# Patient Record
Sex: Female | Born: 1950
Health system: Southern US, Community
[De-identification: ages and names within clinical notes are randomized; demographics above are authoritative.]

## PROBLEM LIST (undated history)

## (undated) DIAGNOSIS — H9190 Unspecified hearing loss, unspecified ear: Secondary | ICD-10-CM

## (undated) DIAGNOSIS — R768 Other specified abnormal immunological findings in serum: Secondary | ICD-10-CM

## (undated) HISTORY — DX: Unspecified hearing loss, unspecified ear: H91.90

## (undated) HISTORY — DX: Other specified abnormal immunological findings in serum: R76.8

---
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
## 1980-08-23 DIAGNOSIS — H9190 Unspecified hearing loss, unspecified ear: Secondary | ICD-10-CM

## 1980-08-23 HISTORY — DX: Unspecified hearing loss, unspecified ear: H91.90

## 1981-08-23 HISTORY — PX: TUBAL LIGATION: SHX77

## 1986-08-23 HISTORY — PX: COCHLEAR IMPLANT: SUR684

## 2013-05-11 ENCOUNTER — Ambulatory Visit: Payer: Self-pay | Admitting: Family Medicine

## 2013-05-14 ENCOUNTER — Encounter: Payer: Self-pay | Admitting: Family Medicine

## 2013-05-14 ENCOUNTER — Ambulatory Visit (INDEPENDENT_AMBULATORY_CARE_PROVIDER_SITE_OTHER): Payer: BC Managed Care – PPO | Admitting: Family Medicine

## 2013-05-14 VITALS — BP 181/86 | HR 71 | Ht 61.0 in | Wt 139.0 lb

## 2013-05-14 DIAGNOSIS — Z13220 Encounter for screening for lipoid disorders: Secondary | ICD-10-CM

## 2013-05-14 DIAGNOSIS — Z131 Encounter for screening for diabetes mellitus: Secondary | ICD-10-CM

## 2013-05-14 DIAGNOSIS — Z9889 Other specified postprocedural states: Secondary | ICD-10-CM

## 2013-05-14 DIAGNOSIS — Z23 Encounter for immunization: Secondary | ICD-10-CM

## 2013-05-14 DIAGNOSIS — Z9621 Cochlear implant status: Secondary | ICD-10-CM | POA: Insufficient documentation

## 2013-05-14 DIAGNOSIS — I10 Essential (primary) hypertension: Secondary | ICD-10-CM

## 2013-05-14 LAB — BASIC METABOLIC PANEL WITH GFR
BUN: 11 mg/dL (ref 6–23)
CO2: 28 mEq/L (ref 19–32)
Calcium: 9.9 mg/dL (ref 8.4–10.5)
Chloride: 104 mEq/L (ref 96–112)
Creat: 0.66 mg/dL (ref 0.50–1.10)
GFR, Est African American: >89 mL/min
GFR, Est Non African American: >89 mL/min
Glucose, Bld: 89 mg/dL (ref 70–99)
Potassium: 4.1 mEq/L (ref 3.5–5.3)
Sodium: 141 mEq/L (ref 135–145)

## 2013-05-14 LAB — LIPID PANEL
Cholesterol: 307 mg/dL — ABNORMAL HIGH (ref 0–200)
HDL: 66 mg/dL (ref >39)
LDL Cholesterol: 210 mg/dL — ABNORMAL HIGH (ref 0–99)
Total CHOL/HDL Ratio: 4.7 Ratio
Triglycerides: 155 mg/dL — ABNORMAL HIGH (ref <150)
VLDL: 31 mg/dL (ref 0–40)

## 2013-05-14 NOTE — Progress Notes (Signed)
CC: Vanessa Gutierrez is a 62 y.o. female is here for Establish Care   Subjective: HPI:  Goes by Vanessa Gutierrez  Patient reports a history of cochlear implant has been placed approximately 20 years ago. Strong family history of hearing loss in young adulthood. She is happy with her current quality of life and ability to hear. She does not believe she's never had any immunizations other than flu shots since she had to cochlear implant surgery.    Patient reports relatively high salt intake over this weekend. She has been taking her blood pressure at home and reports blood pressures are typically between 130-140 systolic unknown diastolic. She has never been on blood pressure occasion in the past. Family history positive for hypertension in her father. No formal exercise routine she typically eats a diet heavy in vegetables  Review of Systems - General ROS: negative for - chills, fever, night sweats, weight gain or weight loss Ophthalmic ROS: negative for - decreased vision Psychological ROS: negative for - anxiety or depression ENT ROS: negative for - hearing change, nasal congestion, tinnitus or allergies Hematological and Lymphatic ROS: negative for - bleeding problems, bruising or swollen lymph nodes Breast ROS: negative Respiratory ROS: no cough, shortness of breath, or wheezing Cardiovascular ROS: no chest pain or dyspnea on exertion Gastrointestinal ROS: no abdominal pain, change in bowel habits, or black or bloody stools Genito-Urinary ROS: negative for - genital discharge, genital ulcers, incontinence or abnormal bleeding from genitals Musculoskeletal ROS: negative for - joint pain or muscle pain Neurological ROS: negative for - headaches or memory loss Dermatological ROS: negative for lumps, mole changes, rash and skin lesion changes  Past Medical History  Diagnosis Date  . Deaf 49     Family History  Problem Relation Age of Onset  . Heart attack Father   . Hyperlipidemia Father    . Hypertension Father   . Stroke Mother      History  Substance Use Topics  . Smoking status: Never Smoker   . Smokeless tobacco: Not on file  . Alcohol Use: Yes     Objective: Filed Vitals:   05/14/13 0852  BP: 181/86  Pulse: 71    General: Alert and Oriented, No Acute Distress HEENT: Pupils equal, round, reactive to light. Conjunctivae clear.  External ears unremarkable, canals clear with intact TMs with appropriate landmarks.  Middle ear appears open without effusion. Pink inferior turbinates.  Moist mucous membranes, pharynx without inflammation nor lesions.  Neck supple without palpable lymphadenopathy nor abnormal masses. Lungs: Clear to auscultation bilaterally, no wheezing/ronchi/rales.  Comfortable work of breathing. Good air movement. Cardiac: Regular rate and rhythm. Normal S1/S2.  No murmurs, rubs, nor gallops.  No carotid bruits Extremities: No peripheral edema.  Strong peripheral pulses.  Mental Status: No depression, anxiety, nor agitation. Skin: Warm and dry.  Assessment & Plan: Vanessa Gutierrez was seen today for establish care.  Diagnoses and associated orders for this visit:  Need for lipid screening - Lipid panel  Diabetes mellitus screening - BASIC METABOLIC PANEL WITH GFR  Essential hypertension, benign - BASIC METABOLIC PANEL WITH GFR  Cochlear implant in place  Need for prophylactic vaccination and inoculation against influenza    Essential hypertension: On repeat blood pressure check by myself she remains in stage II hypertension. Discussed the diagnosis the patient along with diet and exercise interventions to help lower blood pressure. She is adamant that her blood pressures at home are much better, I've recommended that she keep a blood pressure diary for  the next week and drop it off or call us with the values next week, blood pressure remains above 140/90 we'll likely start lisinopril History of cochlear implant: Patient declines pneumococcal  vaccines but is open to the idea of a flu shot today Routine screening for dyslipidemia and diabetes   Return in about 4 weeks (around 06/11/2013).

## 2013-05-14 NOTE — Patient Instructions (Addendum)
DASH Diet  The DASH diet stands for "Dietary Approaches to Stop Hypertension." It is a healthy eating plan that has been shown to reduce high blood pressure (hypertension) in as little as 14 days, while also possibly providing other significant health benefits. These other health benefits include reducing the risk of breast cancer after menopause and reducing the risk of type 2 diabetes, heart disease, colon cancer, and stroke. Health benefits also include weight loss and slowing kidney failure in patients with chronic kidney disease.   DIET GUIDELINES  · Limit salt (sodium). Your diet should contain less than 1500 mg of sodium daily.  · Limit refined or processed carbohydrates. Your diet should include mostly whole grains. Desserts and added sugars should be used sparingly.  · Include small amounts of heart-healthy fats. These types of fats include nuts, oils, and tub margarine. Limit saturated and trans fats. These fats have been shown to be harmful in the body.  CHOOSING FOODS   The following food groups are based on a 2000 calorie diet. See your Registered Dietitian for individual calorie needs.  Grains and Grain Products (6 to 8 servings daily)  · Eat More Often: Whole-wheat bread, brown rice, whole-grain or wheat pasta, quinoa, popcorn without added fat or salt (air popped).  · Eat Less Often: White bread, white pasta, white rice, cornbread.  Vegetables (4 to 5 servings daily)  · Eat More Often: Fresh, frozen, and canned vegetables. Vegetables may be raw, steamed, roasted, or grilled with a minimal amount of fat.  · Eat Less Often/Avoid: Creamed or fried vegetables. Vegetables in a cheese sauce.  Fruit (4 to 5 servings daily)  · Eat More Often: All fresh, canned (in natural juice), or frozen fruits. Dried fruits without added sugar. One hundred percent fruit juice (½ cup [237 mL] daily).  · Eat Less Often: Dried fruits with added sugar. Canned fruit in light or heavy syrup.  Lean Meats, Fish, and Poultry (2  servings or less daily. One serving is 3 to 4 oz [85-114 g]).  · Eat More Often: Ninety percent or leaner ground beef, tenderloin, sirloin. Round cuts of beef, chicken breast, turkey breast. All fish. Grill, bake, or broil your meat. Nothing should be fried.  · Eat Less Often/Avoid: Fatty cuts of meat, turkey, or chicken leg, thigh, or wing. Fried cuts of meat or fish.  Dairy (2 to 3 servings)  · Eat More Often: Low-fat or fat-free milk, low-fat plain or light yogurt, reduced-fat or part-skim cheese.  · Eat Less Often/Avoid: Milk (whole, 2%). Whole milk yogurt. Full-fat cheeses.  Nuts, Seeds, and Legumes (4 to 5 servings per week)  · Eat More Often: All without added salt.  · Eat Less Often/Avoid: Salted nuts and seeds, canned beans with added salt.  Fats and Sweets (limited)  · Eat More Often: Vegetable oils, tub margarines without trans fats, sugar-free gelatin. Mayonnaise and salad dressings.  · Eat Less Often/Avoid: Coconut oils, palm oils, butter, stick margarine, cream, half and half, cookies, candy, pie.  FOR MORE INFORMATION  The Dash Diet Eating Plan: www.dashdiet.org  Document Released: 07/29/2011 Document Revised: 11/01/2011 Document Reviewed: 07/29/2011  ExitCare® Patient Information ©2014 [employer].

## 2013-05-15 ENCOUNTER — Telehealth: Payer: Self-pay | Admitting: Family Medicine

## 2013-05-15 ENCOUNTER — Encounter: Payer: Self-pay | Admitting: Family Medicine

## 2013-05-15 DIAGNOSIS — E785 Hyperlipidemia, unspecified: Secondary | ICD-10-CM | POA: Insufficient documentation

## 2013-05-15 MED ORDER — ATORVASTATIN CALCIUM 20 MG PO TABS: 20.0000 mg | ORAL_TABLET | Freq: Every day | ORAL | Status: DC

## 2013-05-15 NOTE — Telephone Encounter (Signed)
Vanessa Gutierrez, Will you please let [name] know that her total cholesterol and LDL cholesterol were elevated to a degree that puts her at a 8.6% estimated risk of having a heart attack or stroke in the next 10 years.  Current guidelines would recommend she start on cholesterol lowering medication such as atorvastatin to reduce her risk by a third, I've sent this to [facility] on [address] and would encourage her to return for a recheck in 3 months.  Kidney function and fasting blood sugar were perfect.

## 2013-05-15 NOTE — Telephone Encounter (Signed)
Pt's spouse notified( pt is deaf)

## 2013-07-16 ENCOUNTER — Encounter: Payer: Self-pay | Admitting: Family Medicine

## 2013-07-16 DIAGNOSIS — H269 Unspecified cataract: Secondary | ICD-10-CM | POA: Insufficient documentation

## 2016-02-25 ENCOUNTER — Encounter: Payer: Self-pay | Admitting: Family Medicine

## 2016-09-27 ENCOUNTER — Ambulatory Visit: Payer: Self-pay | Admitting: Physician Assistant

## 2016-11-12 ENCOUNTER — Encounter: Payer: Self-pay | Admitting: Family Medicine

## 2016-11-12 ENCOUNTER — Ambulatory Visit (INDEPENDENT_AMBULATORY_CARE_PROVIDER_SITE_OTHER): Payer: Medicare HMO | Admitting: Family Medicine

## 2016-11-12 VITALS — BP 138/80 | HR 76 | Ht 61.0 in | Wt 133.0 lb

## 2016-11-12 DIAGNOSIS — Z780 Asymptomatic menopausal state: Secondary | ICD-10-CM | POA: Diagnosis not present

## 2016-11-12 DIAGNOSIS — Z0000 Encounter for general adult medical examination without abnormal findings: Secondary | ICD-10-CM

## 2016-11-12 DIAGNOSIS — I10 Essential (primary) hypertension: Secondary | ICD-10-CM

## 2016-11-12 DIAGNOSIS — E782 Mixed hyperlipidemia: Secondary | ICD-10-CM | POA: Diagnosis not present

## 2016-11-12 LAB — POCT GLYCOSYLATED HEMOGLOBIN (HGB A1C): Hemoglobin A1C: 5.6

## 2016-11-12 NOTE — Progress Notes (Addendum)
Subjective:    Vanessa Gutierrez is a 66 y.o. female who presents for a Welcome to Medicare exam.   Review of Systems Comprehensive ROS is negative.         Objective:    Today's Vitals   11/12/16 1505  BP: 138/80  Pulse: 76  SpO2: 98%  Weight: 133 lb (60.3 kg)  Height: 5\' 1"  (1.549 m)  Body mass index is 25.13 kg/m.  Medications Outpatient Encounter Prescriptions as of 11/12/2016  Medication Sig  . [DISCONTINUED] atorvastatin (LIPITOR) 20 MG tablet Take 1 tablet (20 mg total) by mouth daily.   No facility-administered encounter medications on file as of 11/12/2016.      History: Past Medical History:  Diagnosis Date  . Deaf 70   Past Surgical History:  Procedure Laterality Date  . COCHLEAR IMPLANT  1988  . right clavicle surgery    . TUBAL LIGATION  1983    Family History  Problem Relation Age of Onset  . Heart attack Father   . Hyperlipidemia Father   . Hypertension Father   . Stroke Mother    Social History   Occupational History  . Not on file.   Social History Main Topics  . Smoking status: Never Smoker  . Smokeless tobacco: Never Used  . Alcohol use Yes  . Drug use: Yes  . Sexual activity: Yes    Partners: Male    Tobacco Counseling Counseling given: Not Answered   Immunizations and Health Maintenance Immunization History  Administered Date(s) Administered  . Influenza,inj,Quad PF,36+ Mos 05/14/2013   Health Maintenance Due  Topic Date Due  . DEXA SCAN  06/09/2016    Activities of Daily Living In your present state of health, do you have any difficulty performing the following activities: 11/15/2016  Hearing? N  Vision? N  Difficulty concentrating or making decisions? N  Walking or climbing stairs? N  Dressing or bathing? N  Doing errands, shopping? N  Some recent data might be hidden    Physical Exam   Physical Exam  Constitutional: She is oriented to person, place, and time. She appears well-developed and  well-nourished.  HENT:  Head: Normocephalic and atraumatic.  Right Ear: External ear normal.  Left Ear: External ear normal.  Nose: Nose normal.  Mouth/Throat: Oropharynx is clear and moist.  TMs and canals are clear.   Eyes: Conjunctivae and EOM are normal. Pupils are equal, round, and reactive to light.  Neck: Neck supple. No thyromegaly present.  Cardiovascular: Normal rate, regular rhythm and normal heart sounds.   Pulmonary/Chest: Effort normal and breath sounds normal. She has no wheezes. Right breast exhibits no inverted nipple, no mass, no nipple discharge, no skin change and no tenderness. Left breast exhibits no inverted nipple, no mass, no nipple discharge, no skin change and no tenderness.  Lymphadenopathy:    She has no cervical adenopathy.  Neurological: She is alert and oriented to person, place, and time.  Skin: Skin is warm and dry.  Psychiatric: She has a normal mood and affect. Her behavior is normal.    Advanced Directives:      Assessment:    This is a routine wellness examination for this patient . Welcome to Medicare   Vision/Hearing screen No exam data present  Dietary issues and exercise activities discussed:  Current Exercise Habits: Home exercise routine, Type of exercise: Other - see comments (Swimming), Frequency (Times/Week): 5  Goals    None     Depression Screen [facility] 2/9  Scores 11/12/2016  PHQ - 2 Score 0     Fall Risk Fall Risk  11/12/2016  Falls in the past year? No    Cognitive Function:     6CIT Screen 11/12/2016  What Year? 0 points  What month? 0 points  What time? 0 points  Count back from 20 0 points  Months in reverse 0 points  Repeat phrase 0 points  Total Score 0    Patient Care Team: Hali Marry, MD as PCP - General (Family Medicine)     Plan:     During the course of the visit the patient was educated and counseled about the following appropriate screening and preventive services:   Vaccines -  declined.   Electrocardiogram - EKG shows rate of 67 bpm, normal axis  Cardiovascular Disease - Pos family hx.   Colorectal cancer screening  Bone density screening - will place order.   Diabetes screening  Glaucoma screening - encouraged her to schedule.   Mammography/PAP - declined.   Mammogram - declined.    Her current medications and allergies were reviewed and needed refills of her chronic medications were ordered. The plan for yearly health maintenance was discussed and all orders and referrals were made as appropriate.  Patient Instructions (the written plan) was given to the patient.   METHENEY,CATHERINE, MD 11/15/2016

## 2016-11-12 NOTE — Patient Instructions (Addendum)
Care Plan :  Vaccines - declined.   Electrocardiogram - performed today   Cardiovascular Disease - Pos family hx.   Colorectal cancer screening  Bone density screening - will place order.   Diabetes screening - A1C done today.   Glaucoma screening - encouraged her to schedule.   Mammography/PAP - declined.   Mammogram - declined.    Heart Disease Prevention Heart disease is a leading cause of death. There are many things you can do to help prevent heart disease. Be physically active Physical activity is good for your heart. It helps control your blood pressure, cholesterol levels, and weight. Try to be physically active every day. Ask your health care provider what activities are best for you. Be a healthy weight Extra weight can strain your heart and affect your blood pressure and cholesterol levels. Lose weight with diet and exercise if recommended by your health care provider. Eat heart-healthy foods Follow a healthy eating plan as recommended by your health care provider or dietitian. Heart-healthy foods include:  High-fiber foods. These include oat bran, oatmeal, and whole-grain breads and cereals.  Fruits and vegetables. Avoid:  Alcohol.  Fried foods.  Foods high in saturated fat. These include meats, butter, whole dairy products, shortening, and coconut or palm oil.  Salty foods. These include canned food, luncheon meat, salty snacks, and fast food. Keep your cholesterol levels under control Cholesterol is a substance that is used for many important functions. When your cholesterol levels are high, cholesterol can stick to the insides of your blood vessels, making them narrow or clog. This can lead to chest pain (angina) and a heart attack. Keep your cholesterol levels under control as recommended by your health care provider. Have your cholesterol checked at least once a year. Target cholesterol levels (in mg/dL) for most people are:  Total cholesterol below  200.  LDL cholesterol below 100.  HDL cholesterol above 40 in men and above 50 in women.  Triglycerides below 150. Keep your blood pressure under control Having high blood pressure (hypertension) puts you at risk for stroke and other forms of heart disease. Keep your blood pressure under control as recommended by your health care provider. Ask your health care provider if you need treatment to lower your blood pressure. If you are 2-42 years of age, have your blood pressure checked every 3-5 years. If you are 22 years of age or older, have your blood pressure checked every year. Do not use tobacco products Tobacco smoke can damage your heart and blood vessels. Do not use any tobacco products including cigarettes, chewing tobacco, or electronic cigarettes. If you need help quitting, ask your health care provider. Take medicines as directed Take medicines only as directed by your health care provider. Ask your health care provider whether you should take an aspirin every day. Taking aspirin can help reduce your risk of heart disease and stroke. Where to find more information: To find out more about heart disease, visit the American Heart Association's website at www.americanheart.org This information is not intended to replace advice given to you by your health care provider. Make sure you discuss any questions you have with your health care provider. Document Released: 03/23/2004 Document Revised: 01/07/2016 Document Reviewed: 10/03/2013 Elsevier Interactive Patient Education  2017 [employer].

## 2016-11-15 ENCOUNTER — Encounter: Payer: Self-pay | Admitting: Family Medicine

## 2016-11-16 ENCOUNTER — Encounter: Payer: Self-pay | Admitting: Family Medicine

## 2016-11-16 DIAGNOSIS — I10 Essential (primary) hypertension: Secondary | ICD-10-CM | POA: Diagnosis not present

## 2016-11-16 DIAGNOSIS — R768 Other specified abnormal immunological findings in serum: Secondary | ICD-10-CM | POA: Insufficient documentation

## 2016-11-16 DIAGNOSIS — E782 Mixed hyperlipidemia: Secondary | ICD-10-CM | POA: Diagnosis not present

## 2016-11-16 HISTORY — DX: Other specified abnormal immunological findings in serum: R76.8

## 2016-11-17 LAB — BASIC METABOLIC PANEL WITH GFR
BUN: 11 mg/dL (ref 7–25)
CO2: 27 mmol/L (ref 20–31)
Calcium: 9.5 mg/dL (ref 8.6–10.4)
Chloride: 103 mmol/L (ref 98–110)
Creat: 0.71 mg/dL (ref 0.50–0.99)
GFR, Est African American: >89 mL/min (ref >=60)
GFR, Est Non African American: >89 mL/min (ref >=60)
Glucose, Bld: 93 mg/dL (ref 65–99)
Potassium: 3.8 mmol/L (ref 3.5–5.3)
Sodium: 140 mmol/L (ref 135–146)

## 2016-11-17 LAB — LIPID PANEL W/REFLEX DIRECT LDL
Cholesterol: 277 mg/dL — ABNORMAL HIGH (ref <200)
HDL: 79 mg/dL (ref >50)
LDL-Cholesterol: 177 mg/dL — ABNORMAL HIGH
Non-HDL Cholesterol (Calc): 198 mg/dL — ABNORMAL HIGH (ref <130)
Total CHOL/HDL Ratio: 3.5 Ratio (ref <5.0)
Triglycerides: 94 mg/dL (ref <150)

## 2016-11-22 ENCOUNTER — Ambulatory Visit (INDEPENDENT_AMBULATORY_CARE_PROVIDER_SITE_OTHER): Payer: Medicare HMO

## 2016-11-22 DIAGNOSIS — Z780 Asymptomatic menopausal state: Secondary | ICD-10-CM | POA: Diagnosis not present

## 2016-11-24 DIAGNOSIS — Z1211 Encounter for screening for malignant neoplasm of colon: Secondary | ICD-10-CM | POA: Diagnosis not present

## 2016-11-24 DIAGNOSIS — Z1212 Encounter for screening for malignant neoplasm of rectum: Secondary | ICD-10-CM | POA: Diagnosis not present

## 2016-12-01 LAB — COLOGUARD: Cologuard: NEGATIVE

## 2016-12-16 ENCOUNTER — Encounter: Payer: Self-pay | Admitting: Family Medicine

## 2016-12-20 DIAGNOSIS — Z0100 Encounter for examination of eyes and vision without abnormal findings: Secondary | ICD-10-CM | POA: Diagnosis not present

## 2016-12-23 ENCOUNTER — Telehealth: Payer: Self-pay

## 2016-12-23 NOTE — Telephone Encounter (Signed)
PT was billed for wellness appt from 11/12/16. She would like this to be resubmitted as this charge should be free.

## 2017-01-24 ENCOUNTER — Encounter: Payer: Self-pay | Admitting: Family Medicine

## 2017-05-05 ENCOUNTER — Encounter: Payer: Self-pay | Admitting: Family Medicine

## 2017-05-05 DIAGNOSIS — C44329 Squamous cell carcinoma of skin of other parts of face: Secondary | ICD-10-CM

## 2017-05-10 DIAGNOSIS — C44329 Squamous cell carcinoma of skin of other parts of face: Secondary | ICD-10-CM | POA: Insufficient documentation

## 2017-05-24 DIAGNOSIS — H903 Sensorineural hearing loss, bilateral: Secondary | ICD-10-CM | POA: Diagnosis not present

## 2017-05-24 DIAGNOSIS — H9190 Unspecified hearing loss, unspecified ear: Secondary | ICD-10-CM | POA: Diagnosis not present

## 2017-05-24 DIAGNOSIS — Z45321 Encounter for adjustment and management of cochlear device: Secondary | ICD-10-CM | POA: Diagnosis not present

## 2017-05-30 DIAGNOSIS — M5137 Other intervertebral disc degeneration, lumbosacral region: Secondary | ICD-10-CM | POA: Diagnosis not present

## 2017-05-30 DIAGNOSIS — M5136 Other intervertebral disc degeneration, lumbar region: Secondary | ICD-10-CM | POA: Diagnosis not present

## 2017-05-30 DIAGNOSIS — M9903 Segmental and somatic dysfunction of lumbar region: Secondary | ICD-10-CM | POA: Diagnosis not present

## 2017-05-30 DIAGNOSIS — M9904 Segmental and somatic dysfunction of sacral region: Secondary | ICD-10-CM | POA: Diagnosis not present

## 2017-05-30 DIAGNOSIS — M9902 Segmental and somatic dysfunction of thoracic region: Secondary | ICD-10-CM | POA: Diagnosis not present

## 2017-05-30 DIAGNOSIS — M5135 Other intervertebral disc degeneration, thoracolumbar region: Secondary | ICD-10-CM | POA: Diagnosis not present

## 2017-06-01 DIAGNOSIS — D485 Neoplasm of uncertain behavior of skin: Secondary | ICD-10-CM | POA: Diagnosis not present

## 2017-06-01 DIAGNOSIS — C44319 Basal cell carcinoma of skin of other parts of face: Secondary | ICD-10-CM | POA: Diagnosis not present

## 2017-06-01 DIAGNOSIS — L821 Other seborrheic keratosis: Secondary | ICD-10-CM | POA: Diagnosis not present

## 2017-06-03 DIAGNOSIS — M5137 Other intervertebral disc degeneration, lumbosacral region: Secondary | ICD-10-CM | POA: Diagnosis not present

## 2017-06-03 DIAGNOSIS — M5135 Other intervertebral disc degeneration, thoracolumbar region: Secondary | ICD-10-CM | POA: Diagnosis not present

## 2017-06-03 DIAGNOSIS — M5136 Other intervertebral disc degeneration, lumbar region: Secondary | ICD-10-CM | POA: Diagnosis not present

## 2017-06-03 DIAGNOSIS — M9902 Segmental and somatic dysfunction of thoracic region: Secondary | ICD-10-CM | POA: Diagnosis not present

## 2017-06-03 DIAGNOSIS — M9903 Segmental and somatic dysfunction of lumbar region: Secondary | ICD-10-CM | POA: Diagnosis not present

## 2017-06-03 DIAGNOSIS — M9904 Segmental and somatic dysfunction of sacral region: Secondary | ICD-10-CM | POA: Diagnosis not present

## 2017-06-10 DIAGNOSIS — M5135 Other intervertebral disc degeneration, thoracolumbar region: Secondary | ICD-10-CM | POA: Diagnosis not present

## 2017-06-10 DIAGNOSIS — M9902 Segmental and somatic dysfunction of thoracic region: Secondary | ICD-10-CM | POA: Diagnosis not present

## 2017-06-10 DIAGNOSIS — M9904 Segmental and somatic dysfunction of sacral region: Secondary | ICD-10-CM | POA: Diagnosis not present

## 2017-06-10 DIAGNOSIS — M5137 Other intervertebral disc degeneration, lumbosacral region: Secondary | ICD-10-CM | POA: Diagnosis not present

## 2017-06-10 DIAGNOSIS — M5136 Other intervertebral disc degeneration, lumbar region: Secondary | ICD-10-CM | POA: Diagnosis not present

## 2017-06-10 DIAGNOSIS — M9903 Segmental and somatic dysfunction of lumbar region: Secondary | ICD-10-CM | POA: Diagnosis not present

## 2017-06-16 ENCOUNTER — Encounter: Payer: Self-pay | Admitting: Family Medicine

## 2017-06-17 DIAGNOSIS — M5136 Other intervertebral disc degeneration, lumbar region: Secondary | ICD-10-CM | POA: Diagnosis not present

## 2017-06-17 DIAGNOSIS — M9903 Segmental and somatic dysfunction of lumbar region: Secondary | ICD-10-CM | POA: Diagnosis not present

## 2017-06-17 DIAGNOSIS — M9904 Segmental and somatic dysfunction of sacral region: Secondary | ICD-10-CM | POA: Diagnosis not present

## 2017-06-17 DIAGNOSIS — M9902 Segmental and somatic dysfunction of thoracic region: Secondary | ICD-10-CM | POA: Diagnosis not present

## 2017-06-17 DIAGNOSIS — M5137 Other intervertebral disc degeneration, lumbosacral region: Secondary | ICD-10-CM | POA: Diagnosis not present

## 2017-06-17 DIAGNOSIS — M5135 Other intervertebral disc degeneration, thoracolumbar region: Secondary | ICD-10-CM | POA: Diagnosis not present

## 2017-06-29 DIAGNOSIS — M9902 Segmental and somatic dysfunction of thoracic region: Secondary | ICD-10-CM | POA: Diagnosis not present

## 2017-06-29 DIAGNOSIS — M5135 Other intervertebral disc degeneration, thoracolumbar region: Secondary | ICD-10-CM | POA: Diagnosis not present

## 2017-06-29 DIAGNOSIS — M9904 Segmental and somatic dysfunction of sacral region: Secondary | ICD-10-CM | POA: Diagnosis not present

## 2017-06-29 DIAGNOSIS — M9903 Segmental and somatic dysfunction of lumbar region: Secondary | ICD-10-CM | POA: Diagnosis not present

## 2017-06-29 DIAGNOSIS — M5137 Other intervertebral disc degeneration, lumbosacral region: Secondary | ICD-10-CM | POA: Diagnosis not present

## 2017-06-29 DIAGNOSIS — M5136 Other intervertebral disc degeneration, lumbar region: Secondary | ICD-10-CM | POA: Diagnosis not present

## 2017-07-07 DIAGNOSIS — C4491 Basal cell carcinoma of skin, unspecified: Secondary | ICD-10-CM | POA: Insufficient documentation

## 2017-07-08 DIAGNOSIS — M5137 Other intervertebral disc degeneration, lumbosacral region: Secondary | ICD-10-CM | POA: Diagnosis not present

## 2017-07-08 DIAGNOSIS — M9903 Segmental and somatic dysfunction of lumbar region: Secondary | ICD-10-CM | POA: Diagnosis not present

## 2017-07-08 DIAGNOSIS — M5136 Other intervertebral disc degeneration, lumbar region: Secondary | ICD-10-CM | POA: Diagnosis not present

## 2017-07-08 DIAGNOSIS — M9902 Segmental and somatic dysfunction of thoracic region: Secondary | ICD-10-CM | POA: Diagnosis not present

## 2017-07-08 DIAGNOSIS — M9904 Segmental and somatic dysfunction of sacral region: Secondary | ICD-10-CM | POA: Diagnosis not present

## 2017-07-08 DIAGNOSIS — M5135 Other intervertebral disc degeneration, thoracolumbar region: Secondary | ICD-10-CM | POA: Diagnosis not present

## 2017-07-26 DIAGNOSIS — M5137 Other intervertebral disc degeneration, lumbosacral region: Secondary | ICD-10-CM | POA: Diagnosis not present

## 2017-07-26 DIAGNOSIS — M5135 Other intervertebral disc degeneration, thoracolumbar region: Secondary | ICD-10-CM | POA: Diagnosis not present

## 2017-07-26 DIAGNOSIS — M9904 Segmental and somatic dysfunction of sacral region: Secondary | ICD-10-CM | POA: Diagnosis not present

## 2017-07-26 DIAGNOSIS — M5136 Other intervertebral disc degeneration, lumbar region: Secondary | ICD-10-CM | POA: Diagnosis not present

## 2017-07-26 DIAGNOSIS — M9902 Segmental and somatic dysfunction of thoracic region: Secondary | ICD-10-CM | POA: Diagnosis not present

## 2017-07-26 DIAGNOSIS — M9903 Segmental and somatic dysfunction of lumbar region: Secondary | ICD-10-CM | POA: Diagnosis not present

## 2017-08-09 DIAGNOSIS — M9902 Segmental and somatic dysfunction of thoracic region: Secondary | ICD-10-CM | POA: Diagnosis not present

## 2017-08-09 DIAGNOSIS — M5135 Other intervertebral disc degeneration, thoracolumbar region: Secondary | ICD-10-CM | POA: Diagnosis not present

## 2017-08-09 DIAGNOSIS — M9903 Segmental and somatic dysfunction of lumbar region: Secondary | ICD-10-CM | POA: Diagnosis not present

## 2017-08-09 DIAGNOSIS — M5136 Other intervertebral disc degeneration, lumbar region: Secondary | ICD-10-CM | POA: Diagnosis not present

## 2017-08-09 DIAGNOSIS — M5137 Other intervertebral disc degeneration, lumbosacral region: Secondary | ICD-10-CM | POA: Diagnosis not present

## 2017-08-09 DIAGNOSIS — M9904 Segmental and somatic dysfunction of sacral region: Secondary | ICD-10-CM | POA: Diagnosis not present

## 2017-08-22 DIAGNOSIS — M9902 Segmental and somatic dysfunction of thoracic region: Secondary | ICD-10-CM | POA: Diagnosis not present

## 2017-08-22 DIAGNOSIS — M5135 Other intervertebral disc degeneration, thoracolumbar region: Secondary | ICD-10-CM | POA: Diagnosis not present

## 2017-08-22 DIAGNOSIS — M5136 Other intervertebral disc degeneration, lumbar region: Secondary | ICD-10-CM | POA: Diagnosis not present

## 2017-08-22 DIAGNOSIS — M9904 Segmental and somatic dysfunction of sacral region: Secondary | ICD-10-CM | POA: Diagnosis not present

## 2017-08-22 DIAGNOSIS — M9903 Segmental and somatic dysfunction of lumbar region: Secondary | ICD-10-CM | POA: Diagnosis not present

## 2017-08-22 DIAGNOSIS — M5137 Other intervertebral disc degeneration, lumbosacral region: Secondary | ICD-10-CM | POA: Diagnosis not present

## 2017-09-06 DIAGNOSIS — M5135 Other intervertebral disc degeneration, thoracolumbar region: Secondary | ICD-10-CM | POA: Diagnosis not present

## 2017-09-06 DIAGNOSIS — M5136 Other intervertebral disc degeneration, lumbar region: Secondary | ICD-10-CM | POA: Diagnosis not present

## 2017-09-06 DIAGNOSIS — M9904 Segmental and somatic dysfunction of sacral region: Secondary | ICD-10-CM | POA: Diagnosis not present

## 2017-09-06 DIAGNOSIS — M9902 Segmental and somatic dysfunction of thoracic region: Secondary | ICD-10-CM | POA: Diagnosis not present

## 2017-09-06 DIAGNOSIS — M5137 Other intervertebral disc degeneration, lumbosacral region: Secondary | ICD-10-CM | POA: Diagnosis not present

## 2017-09-06 DIAGNOSIS — M9903 Segmental and somatic dysfunction of lumbar region: Secondary | ICD-10-CM | POA: Diagnosis not present

## 2017-09-20 DIAGNOSIS — M5137 Other intervertebral disc degeneration, lumbosacral region: Secondary | ICD-10-CM | POA: Diagnosis not present

## 2017-09-20 DIAGNOSIS — M9904 Segmental and somatic dysfunction of sacral region: Secondary | ICD-10-CM | POA: Diagnosis not present

## 2017-09-20 DIAGNOSIS — M9902 Segmental and somatic dysfunction of thoracic region: Secondary | ICD-10-CM | POA: Diagnosis not present

## 2017-09-20 DIAGNOSIS — M5135 Other intervertebral disc degeneration, thoracolumbar region: Secondary | ICD-10-CM | POA: Diagnosis not present

## 2017-09-20 DIAGNOSIS — M9903 Segmental and somatic dysfunction of lumbar region: Secondary | ICD-10-CM | POA: Diagnosis not present

## 2017-09-20 DIAGNOSIS — M5136 Other intervertebral disc degeneration, lumbar region: Secondary | ICD-10-CM | POA: Diagnosis not present

## 2017-10-11 DIAGNOSIS — M9903 Segmental and somatic dysfunction of lumbar region: Secondary | ICD-10-CM | POA: Diagnosis not present

## 2017-10-11 DIAGNOSIS — M5136 Other intervertebral disc degeneration, lumbar region: Secondary | ICD-10-CM | POA: Diagnosis not present

## 2017-10-11 DIAGNOSIS — M5135 Other intervertebral disc degeneration, thoracolumbar region: Secondary | ICD-10-CM | POA: Diagnosis not present

## 2017-10-11 DIAGNOSIS — M9904 Segmental and somatic dysfunction of sacral region: Secondary | ICD-10-CM | POA: Diagnosis not present

## 2017-10-11 DIAGNOSIS — M5137 Other intervertebral disc degeneration, lumbosacral region: Secondary | ICD-10-CM | POA: Diagnosis not present

## 2017-10-11 DIAGNOSIS — M9902 Segmental and somatic dysfunction of thoracic region: Secondary | ICD-10-CM | POA: Diagnosis not present

## 2017-10-20 DIAGNOSIS — R69 Illness, unspecified: Secondary | ICD-10-CM | POA: Diagnosis not present

## 2017-12-12 DIAGNOSIS — M5135 Other intervertebral disc degeneration, thoracolumbar region: Secondary | ICD-10-CM | POA: Diagnosis not present

## 2017-12-12 DIAGNOSIS — M9902 Segmental and somatic dysfunction of thoracic region: Secondary | ICD-10-CM | POA: Diagnosis not present

## 2017-12-12 DIAGNOSIS — M5136 Other intervertebral disc degeneration, lumbar region: Secondary | ICD-10-CM | POA: Diagnosis not present

## 2017-12-12 DIAGNOSIS — M9903 Segmental and somatic dysfunction of lumbar region: Secondary | ICD-10-CM | POA: Diagnosis not present

## 2017-12-12 DIAGNOSIS — M9904 Segmental and somatic dysfunction of sacral region: Secondary | ICD-10-CM | POA: Diagnosis not present

## 2017-12-12 DIAGNOSIS — M5137 Other intervertebral disc degeneration, lumbosacral region: Secondary | ICD-10-CM | POA: Diagnosis not present

## 2017-12-26 DIAGNOSIS — H527 Unspecified disorder of refraction: Secondary | ICD-10-CM | POA: Diagnosis not present

## 2017-12-26 DIAGNOSIS — H40023 Open angle with borderline findings, high risk, bilateral: Secondary | ICD-10-CM | POA: Diagnosis not present

## 2018-02-06 DIAGNOSIS — M9902 Segmental and somatic dysfunction of thoracic region: Secondary | ICD-10-CM | POA: Diagnosis not present

## 2018-02-06 DIAGNOSIS — M9904 Segmental and somatic dysfunction of sacral region: Secondary | ICD-10-CM | POA: Diagnosis not present

## 2018-02-06 DIAGNOSIS — M5136 Other intervertebral disc degeneration, lumbar region: Secondary | ICD-10-CM | POA: Diagnosis not present

## 2018-02-06 DIAGNOSIS — M5137 Other intervertebral disc degeneration, lumbosacral region: Secondary | ICD-10-CM | POA: Diagnosis not present

## 2018-02-06 DIAGNOSIS — M9903 Segmental and somatic dysfunction of lumbar region: Secondary | ICD-10-CM | POA: Diagnosis not present

## 2018-02-06 DIAGNOSIS — M5135 Other intervertebral disc degeneration, thoracolumbar region: Secondary | ICD-10-CM | POA: Diagnosis not present

## 2018-02-09 DIAGNOSIS — M9902 Segmental and somatic dysfunction of thoracic region: Secondary | ICD-10-CM | POA: Diagnosis not present

## 2018-02-09 DIAGNOSIS — M9904 Segmental and somatic dysfunction of sacral region: Secondary | ICD-10-CM | POA: Diagnosis not present

## 2018-02-09 DIAGNOSIS — M9903 Segmental and somatic dysfunction of lumbar region: Secondary | ICD-10-CM | POA: Diagnosis not present

## 2018-02-09 DIAGNOSIS — M5135 Other intervertebral disc degeneration, thoracolumbar region: Secondary | ICD-10-CM | POA: Diagnosis not present

## 2018-02-09 DIAGNOSIS — M5137 Other intervertebral disc degeneration, lumbosacral region: Secondary | ICD-10-CM | POA: Diagnosis not present

## 2018-02-09 DIAGNOSIS — M5136 Other intervertebral disc degeneration, lumbar region: Secondary | ICD-10-CM | POA: Diagnosis not present

## 2018-02-13 DIAGNOSIS — M5136 Other intervertebral disc degeneration, lumbar region: Secondary | ICD-10-CM | POA: Diagnosis not present

## 2018-02-13 DIAGNOSIS — M9902 Segmental and somatic dysfunction of thoracic region: Secondary | ICD-10-CM | POA: Diagnosis not present

## 2018-02-13 DIAGNOSIS — M9904 Segmental and somatic dysfunction of sacral region: Secondary | ICD-10-CM | POA: Diagnosis not present

## 2018-02-13 DIAGNOSIS — M5137 Other intervertebral disc degeneration, lumbosacral region: Secondary | ICD-10-CM | POA: Diagnosis not present

## 2018-02-13 DIAGNOSIS — M5135 Other intervertebral disc degeneration, thoracolumbar region: Secondary | ICD-10-CM | POA: Diagnosis not present

## 2018-02-13 DIAGNOSIS — M9903 Segmental and somatic dysfunction of lumbar region: Secondary | ICD-10-CM | POA: Diagnosis not present

## 2018-02-21 DIAGNOSIS — M9902 Segmental and somatic dysfunction of thoracic region: Secondary | ICD-10-CM | POA: Diagnosis not present

## 2018-02-21 DIAGNOSIS — M9904 Segmental and somatic dysfunction of sacral region: Secondary | ICD-10-CM | POA: Diagnosis not present

## 2018-02-21 DIAGNOSIS — M9903 Segmental and somatic dysfunction of lumbar region: Secondary | ICD-10-CM | POA: Diagnosis not present

## 2018-02-21 DIAGNOSIS — M5136 Other intervertebral disc degeneration, lumbar region: Secondary | ICD-10-CM | POA: Diagnosis not present

## 2018-02-21 DIAGNOSIS — M5137 Other intervertebral disc degeneration, lumbosacral region: Secondary | ICD-10-CM | POA: Diagnosis not present

## 2018-02-21 DIAGNOSIS — M5135 Other intervertebral disc degeneration, thoracolumbar region: Secondary | ICD-10-CM | POA: Diagnosis not present

## 2018-02-24 ENCOUNTER — Encounter: Payer: Self-pay | Admitting: Family Medicine

## 2018-02-24 ENCOUNTER — Ambulatory Visit (INDEPENDENT_AMBULATORY_CARE_PROVIDER_SITE_OTHER): Payer: Self-pay | Admitting: Family Medicine

## 2018-02-24 DIAGNOSIS — S301XXA Contusion of abdominal wall, initial encounter: Secondary | ICD-10-CM

## 2018-02-24 DIAGNOSIS — T23172A Burn of first degree of left wrist, initial encounter: Secondary | ICD-10-CM

## 2018-02-24 DIAGNOSIS — M25562 Pain in left knee: Secondary | ICD-10-CM

## 2018-02-24 DIAGNOSIS — S3011XA Contusion of abdominal wall, initial encounter: Secondary | ICD-10-CM

## 2018-02-24 DIAGNOSIS — S20219A Contusion of unspecified front wall of thorax, initial encounter: Secondary | ICD-10-CM
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
## 2018-03-30 ENCOUNTER — Encounter: Payer: Self-pay | Admitting: Family Medicine

## 2018-03-30 ENCOUNTER — Ambulatory Visit (INDEPENDENT_AMBULATORY_CARE_PROVIDER_SITE_OTHER): Payer: Self-pay | Admitting: Family Medicine

## 2018-03-30 VITALS — BP 136/74 | HR 71 | Ht 61.0 in | Wt 137.0 lb

## 2018-03-30 DIAGNOSIS — M25562 Pain in left knee: Secondary | ICD-10-CM

## 2018-03-30 DIAGNOSIS — T23172A Burn of first degree of left wrist, initial encounter: Secondary | ICD-10-CM

## 2018-03-30 DIAGNOSIS — F439 Reaction to severe stress, unspecified: Secondary | ICD-10-CM
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
## 2018-04-26 ENCOUNTER — Encounter: Payer: Self-pay | Admitting: Family Medicine

## 2018-05-01 DIAGNOSIS — R69 Illness, unspecified: Secondary | ICD-10-CM | POA: Diagnosis not present

## 2018-06-09 ENCOUNTER — Ambulatory Visit (INDEPENDENT_AMBULATORY_CARE_PROVIDER_SITE_OTHER): Payer: Medicare HMO | Admitting: Family Medicine

## 2018-06-09 DIAGNOSIS — Z23 Encounter for immunization: Secondary | ICD-10-CM | POA: Diagnosis not present

## 2018-06-27 ENCOUNTER — Encounter: Payer: Medicare HMO | Admitting: Family Medicine

## 2018-06-27 DIAGNOSIS — H903 Sensorineural hearing loss, bilateral: Secondary | ICD-10-CM | POA: Diagnosis not present

## 2018-06-27 DIAGNOSIS — Z9621 Cochlear implant status: Secondary | ICD-10-CM | POA: Diagnosis not present

## 2018-12-07 DIAGNOSIS — N22 Calculus of urinary tract in diseases classified elsewhere: Secondary | ICD-10-CM | POA: Diagnosis not present

## 2019-04-05 ENCOUNTER — Other Ambulatory Visit: Payer: Self-pay

## 2019-04-05 ENCOUNTER — Ambulatory Visit (INDEPENDENT_AMBULATORY_CARE_PROVIDER_SITE_OTHER): Payer: Medicare HMO | Admitting: Family Medicine

## 2019-04-05 VITALS — Temp 98.2°F

## 2019-04-05 DIAGNOSIS — Z23 Encounter for immunization: Secondary | ICD-10-CM

## 2019-04-05 NOTE — Progress Notes (Signed)
Pt requested High Dose Flu vaccine during her husband's nurse visit. Pt tolerated injection well on Left Deltoid with no complications. No allergies or reactions to flu vaccine in the past. No complaints of chest pain, SOB, or headaches. Pt was advised to follow up with provider as directed.   Flu Vaccine: [place]: [id number] LOT#: [id number] EXP: 01/08/2020

## 2019-04-05 NOTE — Progress Notes (Signed)
Agree with documentation as above.   Jenae Tomasello, MD  

## 2019-04-17 NOTE — Addendum Note (Signed)
Addended by: Mertha Finders on: 04/17/2019 10:09 AM   Modules accepted: Orders

## 2019-06-06 DIAGNOSIS — R69 Illness, unspecified: Secondary | ICD-10-CM | POA: Diagnosis not present

## 2019-07-31 DIAGNOSIS — Z833 Family history of diabetes mellitus: Secondary | ICD-10-CM | POA: Diagnosis not present

## 2019-07-31 DIAGNOSIS — R030 Elevated blood-pressure reading, without diagnosis of hypertension: Secondary | ICD-10-CM | POA: Diagnosis not present

## 2019-07-31 DIAGNOSIS — Z7722 Contact with and (suspected) exposure to environmental tobacco smoke (acute) (chronic): Secondary | ICD-10-CM | POA: Diagnosis not present

## 2019-07-31 DIAGNOSIS — Z8249 Family history of ischemic heart disease and other diseases of the circulatory system: Secondary | ICD-10-CM | POA: Diagnosis not present

## 2019-07-31 DIAGNOSIS — Z809 Family history of malignant neoplasm, unspecified: Secondary | ICD-10-CM | POA: Diagnosis not present

## 2019-07-31 DIAGNOSIS — Z823 Family history of stroke: Secondary | ICD-10-CM | POA: Diagnosis not present

## 2019-09-20 DIAGNOSIS — R69 Illness, unspecified: Secondary | ICD-10-CM | POA: Diagnosis not present

## 2019-12-07 DIAGNOSIS — N22 Calculus of urinary tract in diseases classified elsewhere: Secondary | ICD-10-CM | POA: Diagnosis not present

## 2019-12-26 ENCOUNTER — Encounter: Payer: Self-pay | Admitting: Family Medicine

## 2020-01-22 DIAGNOSIS — Z9621 Cochlear implant status: Secondary | ICD-10-CM | POA: Diagnosis not present

## 2020-01-22 DIAGNOSIS — H9190 Unspecified hearing loss, unspecified ear: Secondary | ICD-10-CM | POA: Diagnosis not present

## 2020-01-22 DIAGNOSIS — Z45321 Encounter for adjustment and management of cochlear device: Secondary | ICD-10-CM | POA: Diagnosis not present

## 2020-03-25 DIAGNOSIS — Z45321 Encounter for adjustment and management of cochlear device: Secondary | ICD-10-CM | POA: Diagnosis not present

## 2020-03-25 DIAGNOSIS — Z9621 Cochlear implant status: Secondary | ICD-10-CM | POA: Diagnosis not present

## 2020-05-16 ENCOUNTER — Encounter: Payer: Self-pay | Admitting: Family Medicine

## 2020-05-16 DIAGNOSIS — Z0000 Encounter for general adult medical examination without abnormal findings: Secondary | ICD-10-CM

## 2020-05-16 DIAGNOSIS — E7849 Other hyperlipidemia: Secondary | ICD-10-CM

## 2020-05-17 DIAGNOSIS — R69 Illness, unspecified: Secondary | ICD-10-CM | POA: Diagnosis not present

## 2020-05-19 NOTE — Telephone Encounter (Signed)
Letter signed.  Available in MyChart.  If needed printed signed copy then please place in my basket.

## 2020-05-19 NOTE — Telephone Encounter (Signed)
Letter pended. Pls advise, if appropriate. Thanks.

## 2020-05-20 ENCOUNTER — Ambulatory Visit: Payer: Medicare HMO

## 2020-06-04 DIAGNOSIS — E7849 Other hyperlipidemia: Secondary | ICD-10-CM | POA: Diagnosis not present

## 2020-06-04 DIAGNOSIS — Z0000 Encounter for general adult medical examination without abnormal findings: Secondary | ICD-10-CM | POA: Diagnosis not present

## 2020-06-04 LAB — CBC WITH DIFFERENTIAL/PLATELET
Hemoglobin: 13.8 g/dL (ref 11.7–15.5)
Neutro Abs: 2965 cells/uL (ref 1500–7800)

## 2020-06-05 LAB — COMPLETE METABOLIC PANEL WITH GFR
AG Ratio: 1.7 (calc) (ref 1.0–2.5)
ALT: 19 U/L (ref 6–29)
AST: 22 U/L (ref 10–35)
Albumin: 4.5 g/dL (ref 3.6–5.1)
Alkaline phosphatase (APISO): 53 U/L (ref 37–153)
BUN: 12 mg/dL (ref 7–25)
CO2: 26 mmol/L (ref 20–32)
Calcium: 9.6 mg/dL (ref 8.6–10.4)
Chloride: 102 mmol/L (ref 98–110)
Creat: 0.75 mg/dL (ref 0.50–0.99)
GFR, Est African American: 95 mL/min/{1.73_m2} (ref >=60)
GFR, Est Non African American: 82 mL/min/{1.73_m2} (ref >=60)
Globulin: 2.6 g/dL (calc) (ref 1.9–3.7)
Glucose, Bld: 102 mg/dL — ABNORMAL HIGH (ref 65–99)
Potassium: 4.5 mmol/L (ref 3.5–5.3)
Sodium: 140 mmol/L (ref 135–146)
Total Bilirubin: 0.7 mg/dL (ref 0.2–1.2)
Total Protein: 7.1 g/dL (ref 6.1–8.1)

## 2020-06-05 LAB — CBC WITH DIFFERENTIAL/PLATELET
Absolute Monocytes: 432 cells/uL (ref 200–950)
Basophils Absolute: 32 cells/uL (ref 0–200)
Basophils Relative: 0.6 %
Eosinophils Absolute: 70 cells/uL (ref 15–500)
Eosinophils Relative: 1.3 %
HCT: 41.6 % (ref 35.0–45.0)
Lymphs Abs: 1901 cells/uL (ref 850–3900)
MCH: 29.2 pg (ref 27.0–33.0)
MCHC: 33.2 g/dL (ref 32.0–36.0)
MCV: 87.9 fL (ref 80.0–100.0)
MPV: 10.2 fL (ref 7.5–12.5)
Monocytes Relative: 8 %
Neutrophils Relative %: 54.9 %
Platelets: 238 10*3/uL (ref 140–400)
RBC: 4.73 10*6/uL (ref 3.80–5.10)
RDW: 12.5 % (ref 11.0–15.0)
Total Lymphocyte: 35.2 %
WBC: 5.4 10*3/uL (ref 3.8–10.8)

## 2020-06-05 LAB — LIPID PANEL W/REFLEX DIRECT LDL
Cholesterol: 329 mg/dL — ABNORMAL HIGH (ref <200)
HDL: 69 mg/dL (ref >=50)
LDL Cholesterol (Calc): 231 mg/dL (calc) — ABNORMAL HIGH
Non-HDL Cholesterol (Calc): 260 mg/dL (calc) — ABNORMAL HIGH (ref <130)
Total CHOL/HDL Ratio: 4.8 (calc) (ref <5.0)
Triglycerides: 136 mg/dL (ref <150)

## 2020-06-12 ENCOUNTER — Telehealth: Payer: Self-pay | Admitting: Neurology

## 2020-06-12 ENCOUNTER — Ambulatory Visit (INDEPENDENT_AMBULATORY_CARE_PROVIDER_SITE_OTHER): Payer: Medicare HMO | Admitting: Family Medicine

## 2020-06-12 ENCOUNTER — Encounter: Payer: Self-pay | Admitting: Family Medicine

## 2020-06-12 VITALS — BP 152/70 | HR 74 | Ht 61.0 in | Wt 137.0 lb

## 2020-06-12 DIAGNOSIS — R7309 Other abnormal glucose: Secondary | ICD-10-CM | POA: Diagnosis not present

## 2020-06-12 DIAGNOSIS — Z1211 Encounter for screening for malignant neoplasm of colon: Secondary | ICD-10-CM | POA: Diagnosis not present

## 2020-06-12 DIAGNOSIS — R030 Elevated blood-pressure reading, without diagnosis of hypertension: Secondary | ICD-10-CM | POA: Diagnosis not present

## 2020-06-12 DIAGNOSIS — Z0000 Encounter for general adult medical examination without abnormal findings: Secondary | ICD-10-CM | POA: Diagnosis not present

## 2020-06-12 DIAGNOSIS — Z23 Encounter for immunization: Secondary | ICD-10-CM | POA: Diagnosis not present

## 2020-06-12 LAB — POCT GLYCOSYLATED HEMOGLOBIN (HGB A1C): Hemoglobin A1C: 5.6 % (ref 4.0–5.6)

## 2020-06-12 MED ORDER — AMBULATORY NON FORMULARY MEDICATION: 0 refills | Status: DC

## 2020-06-12 NOTE — Patient Instructions (Signed)

## 2020-06-12 NOTE — Telephone Encounter (Signed)
Cologuard order faxed to [phone] with confirmation received. They will contact the patient directly.   

## 2020-06-12 NOTE — Progress Notes (Signed)
Subjective:     Vanessa Gutierrez is a 69 y.o. female and is here for a comprehensive physical exam. The patient reports problems - weight gain.  She does report that she has gained some weight over the last year.  She admits she has been doing a lot more stress eating.  Both of her children are getting divorced and this has been a little bit stressful for her even though she knows it is probably the best thing.  She says that her son has actually been coming over more since the divorce and so bringing a lot of takeout food and things like that.  She has been eating a lot of salty foods as well as sweets.  She Clines colonoscopy but is willing to do Cologuard.  She gets plenty of exercise she walks every morning about three quarters of a mile, swims for an hour 5 days a week and then usually walks her dog in the afternoon.  Social History   Socioeconomic History  . Marital status: Married    Spouse name: Elta Guadeloupe   . Number of children: 2  . Years of education: HS  . Highest education level: Not on file  Occupational History  . Occupation: housewife  Tobacco Use  . Smoking status: Never Smoker  . Smokeless tobacco: Never Used  Substance and Sexual Activity  . Alcohol use: Yes    Alcohol/week: 9.0 standard drinks    Types: 9 Standard drinks or equivalent per week  . Drug use: Yes  . Sexual activity: Not Currently    Partners: Male  Other Topics Concern  . Not on file  Social History Narrative   2 caffeinated drinks daily. Regular exercise.   Social Determinants of Health   Financial Resource Strain:   . Difficulty of Paying Living Expenses: Not on file  Food Insecurity:   . Worried About Charity fundraiser in the Last Year: Not on file  . Ran Out of Food in the Last Year: Not on file  Transportation Needs:   . Lack of Transportation (Medical): Not on file  . Lack of Transportation (Non-Medical): Not on file  Physical Activity:   . Days of Exercise per Week: Not on file  .  Minutes of Exercise per Session: Not on file  Stress:   . Feeling of Stress : Not on file  Social Connections:   . Frequency of Communication with Friends and Family: Not on file  . Frequency of Social Gatherings with Friends and Family: Not on file  . Attends Religious Services: Not on file  . Active Member of Clubs or Organizations: Not on file  . Attends Archivist Meetings: Not on file  . Marital Status: Not on file  Intimate Partner Violence:   . Fear of Current or Ex-Partner: Not on file  . Emotionally Abused: Not on file  . Physically Abused: Not on file  . Sexually Abused: Not on file   Health Maintenance  Topic Date Due  . COLONOSCOPY  06/12/2021 (Originally 06/09/2001)  . MAMMOGRAM  11/13/2026 (Originally 06/09/2001)  . TETANUS/TDAP  11/13/2026 (Originally 06/09/1970)  . Hepatitis C Screening  11/13/2026 (Originally 09-10-1950)  . PNA vac Low Risk Adult (2 of 2 - PCV13) 06/12/2021  . DEXA SCAN  11/22/2021  . INFLUENZA VACCINE  Completed  . COVID-19 Vaccine  Completed    The following portions of the patient's history were reviewed and updated as appropriate: allergies, current medications, past family history, past medical history, past  social history, past surgical history and problem list.  Review of Systems A comprehensive review of systems was negative.   Objective:    BP (!) 152/70   Pulse 74   Ht 5\' 1"  (1.549 m)   Wt 137 lb (62.1 kg)   SpO2 100%   BMI 25.89 kg/m  General appearance: alert, cooperative and appears stated age Head: Normocephalic, without obvious abnormality, atraumatic Eyes: conj clear, EOMI, PEERLA Ears: normal TM's and external ear canals both ears Nose: Nares normal. Septum midline. Mucosa normal. No drainage or sinus tenderness. Throat: lips, mucosa, and tongue normal; teeth and gums normal Neck: no adenopathy, no carotid bruit, no JVD, supple, symmetrical, trachea midline and thyroid not enlarged, symmetric, no  tenderness/mass/nodules Back: symmetric, no curvature. ROM normal. No CVA tenderness. Lungs: clear to auscultation bilaterally Heart: regular rate and rhythm, S1, S2 normal, no murmur, click, rub or gallop Abdomen: soft, non-tender; bowel sounds normal; no masses,  no organomegaly Extremities: extremities normal, atraumatic, no cyanosis or edema Pulses: 2+ and symmetric Skin: Skin color, texture, turgor normal. No rashes or lesions Lymph nodes: Cervical adenopathy: nl and Supraclavicular adenopathy: nl Neurologic: Alert and oriented X 3, normal strength and tone. Normal symmetric reflexes. Normal coordination and gait    Assessment:    Healthy female exam.     Plan:      Wellness exam See After Visit Summary for Counseling Recommendations   Keep up a regular exercise program and make sure you are eating a healthy diet Try to eat 4 servings of dairy a day, or if you are lactose intolerant take a calcium with vitamin D daily.  Your vaccines are up to date.    Elevated blood pressure without diagnosis of hypertension-blood pressure is quite elevated today.  Really encouraged her to cut back on salt and sweet foods and plan to come back in in 2 weeks for nurse visit to recheck pressure.  She does exercise regularly which is great.  She is interested in getting her tetanus vaccine but will need to get it at the pharmacy since she has Medicare.  Prescription printed.  Pneumovax 23 given today.  Declined colonoscopy but okay with doing Cologuard.  Order placed and faxed today. Elevated blood pressure

## 2020-06-23 DIAGNOSIS — Z1211 Encounter for screening for malignant neoplasm of colon: Secondary | ICD-10-CM | POA: Diagnosis not present

## 2020-07-02 LAB — COLOGUARD
COLOGUARD: NEGATIVE
Cologuard: NEGATIVE

## 2020-07-02 LAB — EXTERNAL GENERIC LAB PROCEDURE: COLOGUARD: NEGATIVE

## 2020-07-03 ENCOUNTER — Telehealth: Payer: Self-pay | Admitting: Family Medicine

## 2020-07-03 NOTE — Telephone Encounter (Signed)
Call pt: cologuard was negative which is great!  Repeat colon cancer screening in 3 years.

## 2020-07-04 ENCOUNTER — Encounter: Payer: Self-pay | Admitting: Family Medicine

## 2020-07-08 NOTE — Telephone Encounter (Signed)
Pt advised.

## 2020-08-21 ENCOUNTER — Encounter: Payer: Self-pay | Admitting: Family Medicine

## 2020-09-08 NOTE — Progress Notes (Signed)
Great news! Your Cologuard test is negative.  Recommend repeat colon cancer screening in 3 years.

## 2020-09-16 DIAGNOSIS — M461 Sacroiliitis, not elsewhere classified: Secondary | ICD-10-CM | POA: Diagnosis not present

## 2020-09-16 DIAGNOSIS — M5117 Intervertebral disc disorders with radiculopathy, lumbosacral region: Secondary | ICD-10-CM | POA: Diagnosis not present

## 2020-09-16 DIAGNOSIS — M9903 Segmental and somatic dysfunction of lumbar region: Secondary | ICD-10-CM | POA: Diagnosis not present

## 2020-09-16 DIAGNOSIS — M9904 Segmental and somatic dysfunction of sacral region: Secondary | ICD-10-CM | POA: Diagnosis not present

## 2020-09-16 DIAGNOSIS — M25551 Pain in right hip: Secondary | ICD-10-CM | POA: Diagnosis not present

## 2020-09-16 DIAGNOSIS — M9905 Segmental and somatic dysfunction of pelvic region: Secondary | ICD-10-CM | POA: Diagnosis not present

## 2020-09-17 DIAGNOSIS — M9904 Segmental and somatic dysfunction of sacral region: Secondary | ICD-10-CM | POA: Diagnosis not present

## 2020-09-17 DIAGNOSIS — M546 Pain in thoracic spine: Secondary | ICD-10-CM | POA: Diagnosis not present

## 2020-09-17 DIAGNOSIS — M25551 Pain in right hip: Secondary | ICD-10-CM | POA: Diagnosis not present

## 2020-09-17 DIAGNOSIS — M9902 Segmental and somatic dysfunction of thoracic region: Secondary | ICD-10-CM | POA: Diagnosis not present

## 2020-09-17 DIAGNOSIS — M5117 Intervertebral disc disorders with radiculopathy, lumbosacral region: Secondary | ICD-10-CM | POA: Diagnosis not present

## 2020-09-17 DIAGNOSIS — M9903 Segmental and somatic dysfunction of lumbar region: Secondary | ICD-10-CM | POA: Diagnosis not present

## 2020-09-17 DIAGNOSIS — M9905 Segmental and somatic dysfunction of pelvic region: Secondary | ICD-10-CM | POA: Diagnosis not present

## 2020-09-17 DIAGNOSIS — M461 Sacroiliitis, not elsewhere classified: Secondary | ICD-10-CM | POA: Diagnosis not present

## 2020-09-29 DIAGNOSIS — M9905 Segmental and somatic dysfunction of pelvic region: Secondary | ICD-10-CM | POA: Diagnosis not present

## 2020-09-29 DIAGNOSIS — M9902 Segmental and somatic dysfunction of thoracic region: Secondary | ICD-10-CM | POA: Diagnosis not present

## 2020-09-29 DIAGNOSIS — M25551 Pain in right hip: Secondary | ICD-10-CM | POA: Diagnosis not present

## 2020-09-29 DIAGNOSIS — M5117 Intervertebral disc disorders with radiculopathy, lumbosacral region: Secondary | ICD-10-CM | POA: Diagnosis not present

## 2020-09-29 DIAGNOSIS — M546 Pain in thoracic spine: Secondary | ICD-10-CM | POA: Diagnosis not present

## 2020-09-29 DIAGNOSIS — M461 Sacroiliitis, not elsewhere classified: Secondary | ICD-10-CM | POA: Diagnosis not present

## 2020-09-29 DIAGNOSIS — M9903 Segmental and somatic dysfunction of lumbar region: Secondary | ICD-10-CM | POA: Diagnosis not present

## 2020-09-29 DIAGNOSIS — M9904 Segmental and somatic dysfunction of sacral region: Secondary | ICD-10-CM | POA: Diagnosis not present

## 2020-10-03 ENCOUNTER — Telehealth: Payer: Self-pay

## 2020-10-03 DIAGNOSIS — M5137 Other intervertebral disc degeneration, lumbosacral region: Secondary | ICD-10-CM | POA: Diagnosis not present

## 2020-10-03 DIAGNOSIS — M25552 Pain in left hip: Secondary | ICD-10-CM

## 2020-10-03 DIAGNOSIS — M545 Low back pain, unspecified: Secondary | ICD-10-CM

## 2020-10-03 DIAGNOSIS — M5135 Other intervertebral disc degeneration, thoracolumbar region: Secondary | ICD-10-CM | POA: Diagnosis not present

## 2020-10-03 DIAGNOSIS — M5136 Other intervertebral disc degeneration, lumbar region: Secondary | ICD-10-CM | POA: Diagnosis not present

## 2020-10-03 DIAGNOSIS — M9903 Segmental and somatic dysfunction of lumbar region: Secondary | ICD-10-CM | POA: Diagnosis not present

## 2020-10-03 DIAGNOSIS — M9904 Segmental and somatic dysfunction of sacral region: Secondary | ICD-10-CM | POA: Diagnosis not present

## 2020-10-03 DIAGNOSIS — M9902 Segmental and somatic dysfunction of thoracic region: Secondary | ICD-10-CM | POA: Diagnosis not present

## 2020-10-03 NOTE — Telephone Encounter (Signed)
Joelene Millin from [facility] called wanted Dr Madilyn Fireman to order x-rays. Her insurance will only pay for xray that are ordered by PCP. I called and left a message for a return call.   [facility]  [address], [place], [place] [address]  [phone]

## 2020-10-06 DIAGNOSIS — M9902 Segmental and somatic dysfunction of thoracic region: Secondary | ICD-10-CM | POA: Diagnosis not present

## 2020-10-06 DIAGNOSIS — M9903 Segmental and somatic dysfunction of lumbar region: Secondary | ICD-10-CM | POA: Diagnosis not present

## 2020-10-06 DIAGNOSIS — M9904 Segmental and somatic dysfunction of sacral region: Secondary | ICD-10-CM | POA: Diagnosis not present

## 2020-10-06 DIAGNOSIS — M5135 Other intervertebral disc degeneration, thoracolumbar region: Secondary | ICD-10-CM | POA: Diagnosis not present

## 2020-10-06 DIAGNOSIS — M5136 Other intervertebral disc degeneration, lumbar region: Secondary | ICD-10-CM | POA: Diagnosis not present

## 2020-10-06 DIAGNOSIS — M5137 Other intervertebral disc degeneration, lumbosacral region: Secondary | ICD-10-CM | POA: Diagnosis not present

## 2020-10-08 ENCOUNTER — Encounter: Payer: Self-pay | Admitting: Family Medicine

## 2020-10-08 NOTE — Telephone Encounter (Signed)
Please call patient and let her know that I received notes and fax request from Dr. Romilda Garret about getting some updated imaging for her left hip and lumbar spine.  These orders were placed and she is welcome to go to our imaging department between 8 AM and 5 PM Monday through Friday to get those done.  I am also encouraging to her to get those burned onto a CD to take with her for Dr. Romilda Garret to look at them as well.

## 2020-10-08 NOTE — Telephone Encounter (Signed)
LVM advising pt of recommendations and to rtn call if any questions

## 2020-12-26 DIAGNOSIS — M9902 Segmental and somatic dysfunction of thoracic region: Secondary | ICD-10-CM | POA: Diagnosis not present

## 2020-12-26 DIAGNOSIS — M5135 Other intervertebral disc degeneration, thoracolumbar region: Secondary | ICD-10-CM | POA: Diagnosis not present

## 2020-12-26 DIAGNOSIS — M9903 Segmental and somatic dysfunction of lumbar region: Secondary | ICD-10-CM | POA: Diagnosis not present

## 2020-12-26 DIAGNOSIS — M5137 Other intervertebral disc degeneration, lumbosacral region: Secondary | ICD-10-CM | POA: Diagnosis not present

## 2020-12-26 DIAGNOSIS — M9904 Segmental and somatic dysfunction of sacral region: Secondary | ICD-10-CM | POA: Diagnosis not present

## 2020-12-26 DIAGNOSIS — M5136 Other intervertebral disc degeneration, lumbar region: Secondary | ICD-10-CM | POA: Diagnosis not present

## 2021-01-02 DIAGNOSIS — M9902 Segmental and somatic dysfunction of thoracic region: Secondary | ICD-10-CM | POA: Diagnosis not present

## 2021-01-02 DIAGNOSIS — M5136 Other intervertebral disc degeneration, lumbar region: Secondary | ICD-10-CM | POA: Diagnosis not present

## 2021-01-02 DIAGNOSIS — M9903 Segmental and somatic dysfunction of lumbar region: Secondary | ICD-10-CM | POA: Diagnosis not present

## 2021-01-02 DIAGNOSIS — M9904 Segmental and somatic dysfunction of sacral region: Secondary | ICD-10-CM | POA: Diagnosis not present

## 2021-01-02 DIAGNOSIS — M5135 Other intervertebral disc degeneration, thoracolumbar region: Secondary | ICD-10-CM | POA: Diagnosis not present

## 2021-01-02 DIAGNOSIS — M5137 Other intervertebral disc degeneration, lumbosacral region: Secondary | ICD-10-CM | POA: Diagnosis not present

## 2021-03-11 DIAGNOSIS — H43313 Vitreous membranes and strands, bilateral: Secondary | ICD-10-CM | POA: Diagnosis not present

## 2021-03-11 DIAGNOSIS — H524 Presbyopia: Secondary | ICD-10-CM | POA: Diagnosis not present

## 2021-03-25 DIAGNOSIS — Z0100 Encounter for examination of eyes and vision without abnormal findings: Secondary | ICD-10-CM | POA: Diagnosis not present

## 2021-04-08 DIAGNOSIS — H40053 Ocular hypertension, bilateral: Secondary | ICD-10-CM | POA: Diagnosis not present

## 2021-06-09 DIAGNOSIS — Z45321 Encounter for adjustment and management of cochlear device: Secondary | ICD-10-CM | POA: Diagnosis not present

## 2021-06-09 DIAGNOSIS — H903 Sensorineural hearing loss, bilateral: Secondary | ICD-10-CM | POA: Diagnosis not present

## 2021-07-10 ENCOUNTER — Encounter: Payer: Self-pay | Admitting: Family Medicine

## 2021-08-20 ENCOUNTER — Encounter: Payer: Self-pay | Admitting: Family Medicine

## 2021-12-28 DIAGNOSIS — Z0100 Encounter for examination of eyes and vision without abnormal findings: Secondary | ICD-10-CM | POA: Diagnosis not present

## 2021-12-28 DIAGNOSIS — H25813 Combined forms of age-related cataract, bilateral: Secondary | ICD-10-CM | POA: Diagnosis not present

## 2021-12-28 DIAGNOSIS — H5213 Myopia, bilateral: Secondary | ICD-10-CM | POA: Diagnosis not present

## 2021-12-28 DIAGNOSIS — H524 Presbyopia: Secondary | ICD-10-CM | POA: Diagnosis not present

## 2022-03-05 ENCOUNTER — Encounter: Payer: Self-pay | Admitting: Family Medicine

## 2022-03-25 ENCOUNTER — Other Ambulatory Visit: Payer: Self-pay

## 2022-03-25 ENCOUNTER — Encounter (INDEPENDENT_AMBULATORY_CARE_PROVIDER_SITE_OTHER): Payer: Medicare HMO | Admitting: Family Medicine

## 2022-03-25 DIAGNOSIS — Z1211 Encounter for screening for malignant neoplasm of colon: Secondary | ICD-10-CM

## 2022-03-25 DIAGNOSIS — H903 Sensorineural hearing loss, bilateral: Secondary | ICD-10-CM | POA: Insufficient documentation

## 2022-03-25 NOTE — Telephone Encounter (Signed)
I spent 5 total minutes of online digital evaluation and management services in this patient-initiated request for online care. 

## 2022-04-14 ENCOUNTER — Encounter: Payer: Self-pay | Admitting: General Practice

## 2022-07-06 DIAGNOSIS — Z9621 Cochlear implant status: Secondary | ICD-10-CM | POA: Diagnosis not present

## 2022-07-06 DIAGNOSIS — H903 Sensorineural hearing loss, bilateral: Secondary | ICD-10-CM | POA: Diagnosis not present

## 2022-07-09 ENCOUNTER — Telehealth: Payer: Self-pay | Admitting: Family Medicine

## 2022-07-09 NOTE — Telephone Encounter (Signed)
Call patient.  I really like to get her scheduled for an updated mammogram downstairs unless she goes somewhere else it looks like she is very overdue at also like to see if she be willing to do at least the stool test called Cologuard to screen for colon cancer since I know she is not interested in doing a full scope.

## 2022-07-12 NOTE — Telephone Encounter (Signed)
Started conversation with patient but mid conversation- she states "no thank you" - thank you for calling and hung up on me.

## 2023-06-21 ENCOUNTER — Encounter: Payer: Self-pay | Admitting: Family Medicine

## 2023-06-21 DIAGNOSIS — Z1211 Encounter for screening for malignant neoplasm of colon: Secondary | ICD-10-CM

## 2023-06-21 NOTE — Telephone Encounter (Signed)
It was probably the result note I suspect because he did have blood work drawn that day so that would have been the only message that I think we sent to them.  Orders Placed This Encounter  Procedures   Cologuard

## 2023-07-11 LAB — COLOGUARD: COLOGUARD: NEGATIVE

## 2023-07-11 NOTE — Progress Notes (Signed)
Great news! Your Cologuard test is negative.  Recommend repeat colon cancer screening in 3 years.

## 2024-02-26 ENCOUNTER — Telehealth: Payer: Self-pay | Admitting: Family Medicine

## 2024-02-26 NOTE — Telephone Encounter (Signed)
 LMOM in regards to her husbands passing.

## 2024-03-05 ENCOUNTER — Ambulatory Visit: Payer: Self-pay

## 2024-03-05 ENCOUNTER — Encounter: Payer: Self-pay | Admitting: Family Medicine

## 2024-03-05 ENCOUNTER — Ambulatory Visit
Admission: EM | Admit: 2024-03-05 | Discharge: 2024-03-05 | Disposition: A | Discharge status: Home / Self Care | Attending: Family Medicine | Admitting: Family Medicine

## 2024-03-05 ENCOUNTER — Other Ambulatory Visit: Payer: Self-pay

## 2024-03-05 ENCOUNTER — Ambulatory Visit

## 2024-03-05 DIAGNOSIS — R030 Elevated blood-pressure reading, without diagnosis of hypertension: Secondary | ICD-10-CM | POA: Diagnosis not present

## 2024-03-05 DIAGNOSIS — J3089 Other allergic rhinitis: Secondary | ICD-10-CM

## 2024-03-05 DIAGNOSIS — R059 Cough, unspecified: Secondary | ICD-10-CM | POA: Diagnosis not present

## 2024-03-05 DIAGNOSIS — J209 Acute bronchitis, unspecified: Secondary | ICD-10-CM

## 2024-03-05 MED ORDER — PREDNISONE 20 MG PO TABS: 40.0000 mg | ORAL_TABLET | Freq: Every day | ORAL | 0 refills | Status: DC

## 2024-03-05 MED ORDER — DOXYCYCLINE HYCLATE 100 MG PO CAPS: 100.0000 mg | ORAL_CAPSULE | Freq: Two times a day (BID) | ORAL | 0 refills | Status: DC

## 2024-03-05 NOTE — Telephone Encounter (Signed)
 Left message for patient to call back to schedule a follow up urgent care visit.

## 2024-03-05 NOTE — Telephone Encounter (Signed)
 FYI Only or Action Required?: Action required by provider: request for appointment.  Patient was last seen in primary care on 02/2020.  Called Nurse Triage reporting Cough.  Symptoms began 2 weeks ago.  Interventions attempted: OTC medications: alka seltzer plus ginger tea soup .  Symptoms are: gradually worsening.  Triage Disposition: See Physician Within 24 Hours  Patient/caregiver understands and will follow disposition?: Pt needs to reestablsih at this clinic. Previous PCP not taking pt's . Offered NP appt with [facility], but advised will not be able to see her for this acute issue that she will need to go to UC. Offered Virtual UC appt but refused. Pt became emotional husband recent death. Pt upset at this NT unable to schedule for appt. Pt disconnected call.

## 2024-03-05 NOTE — Telephone Encounter (Signed)
 Copied from CRM [id number]. Topic: Clinical - Red Word Triage >> Mar 05, 2024  7:37 AM Laurier BROCKS wrote: Red Word that prompted transfer to Nurse Triage: Patient has been having some upper respiratory problems for the last 2 weeks. Some symptoms include: clear snot, drainage, non stop coughing which has been causing stomach muscle pain. Reason for Disposition . SEVERE coughing spells (e.g., whooping sound after coughing, vomiting after coughing)  Answer Assessment - Initial Assessment Questions 1. ONSET: When did the cough begin?      2 weeks  2. SEVERITY: How bad is the cough today?      Non stop cough not sleeping  3. SPUTUM: Describe the color of your sputum (e.g., none, dry cough; clear, white, yellow, green)     Clear to pale yellow  4. HEMOPTYSIS: Are you coughing up any blood? If Yes, ask: How much? (e.g., flecks, streaks, tablespoons, etc.)     N/a 5. DIFFICULTY BREATHING: Are you having difficulty breathing? If Yes, ask: How bad is it? (e.g., mild, moderate, severe)      no 6. FEVER: Do you have a fever? If Yes, ask: What is your temperature, how was it measured, and when did it start?     no 9. PE RISK FACTORS: Do you have a history of blood clots? (or: recent major surgery, recent prolonged travel, bedridden)     *No Answer* 10. OTHER SYMPTOMS: Do you have any other symptoms? (e.g., runny nose, wheezing, chest pain)       Runny nose, chest soreness 11. PREGNANCY: Is there any chance you are pregnant? When was your last menstrual period?       *No Answer* 12. TRAVEL: Have you traveled out of the country in the last month? (e.g., travel history, exposures)       *No Answer*  Protocols used: Cough - Acute Productive-A-AH

## 2024-03-05 NOTE — ED Provider Notes (Signed)
 [facility]    CSN: [id number] Arrival date & time: 03/05/24  0835      History   Chief Complaint Chief Complaint  Patient presents with   Cough    HPI Vanessa Gutierrez is a 73 y.o. female.   Patient states that she started with a slight cough a couple of weeks ago.  It happened after she made peach jam.  She states that she is allergic to peach fuzz and that makes her skin itch.  In any event she continues to have cough and chest congestion.  Her chest hurts from the coughing.  It is a harsh cough.  Mostly white sputum some yellow streaks.  No underlying asthma or COPD.  Non-smoker. Patient has an elevated blood pressure today.  She feels like it is because she is upset.  She agrees to follow-up with her primary care doctor in a couple of weeks    Past Medical History:  Diagnosis Date   Deaf 69   Hepatitis C antibody positive in blood 11/16/2016   Negative carrier but pos antibody    Patient Active Problem List   Diagnosis Date Noted   Bilateral sensorineural hearing loss 03/25/2022   Elevated BP without diagnosis of hypertension 06/12/2020   Basal cell carcinoma 07/07/2017   Hepatitis C antibody positive in blood 11/16/2016   Cataract 07/16/2013   Hyperlipidemia 05/15/2013   Cochlear implant in place 05/14/2013    Past Surgical History:  Procedure Laterality Date   COCHLEAR IMPLANT  1988   right clavicle surgery  1967   TUBAL LIGATION  1983    OB History   No obstetric history on file.      Home Medications    Prior to Admission medications   Medication Sig Start Date End Date Taking? Authorizing Provider  doxycycline  (VIBRAMYCIN ) 100 MG capsule Take 1 capsule (100 mg total) by mouth 2 (two) times daily. 03/05/24  Yes Maranda Jamee Jacob, MD  predniSONE  (DELTASONE ) 20 MG tablet Take 2 tablets (40 mg total) by mouth daily with breakfast. 03/05/24  Yes Maranda Jamee Jacob, MD    Family History Family History  Problem Relation Age of Onset    Heart attack Father    Hyperlipidemia Father    Hypertension Father    Arthritis Father    Heart disease Father    Stroke Mother    Hearing loss Mother    Vision loss Mother    Esophageal cancer Sister    Hearing loss Sister    Heart attack Brother    Hyperlipidemia Brother    Hypertension Brother    Heart disease Brother    Obesity Brother    Heart attack Son    Hyperlipidemia Son    Hypertension Son    Heart disease Son    Obesity Son    Hearing loss Maternal Aunt    Hearing loss Maternal Uncle    Hearing loss Maternal Grandmother    Obesity Brother     Social History Social History   Tobacco Use   Smoking status: Never   Smokeless tobacco: Never  Substance Use Topics   Alcohol use: Yes    Alcohol/week: 9.0 standard drinks of alcohol    Types: 9 Standard drinks or equivalent per week   Drug use: Never     Allergies   Peach [prunus persica]   Review of Systems Review of Systems See HPI  Physical Exam Triage Vital Signs ED Triage Vitals  Encounter Vitals Group  BP 03/05/24 0842 (!) 198/94     Girls Systolic BP Percentile --      Girls Diastolic BP Percentile --      Boys Systolic BP Percentile --      Boys Diastolic BP Percentile --      Pulse Rate 03/05/24 0842 89     Resp 03/05/24 0842 16     Temp 03/05/24 0842 98.9 F (37.2 C)     Temp src --      SpO2 03/05/24 0842 95 %     Weight --      Height --      Head Circumference --      Peak Flow --      Pain Score 03/05/24 0847 0     Pain Loc --      Pain Education --      Exclude from Growth Chart --    No data found.  Updated Vital Signs BP (!) 198/94   Pulse 89   Temp 98.9 F (37.2 C)   Resp 16   SpO2 95%      Physical Exam Constitutional:      General: She is not in acute distress.    Appearance: She is well-developed. She is ill-appearing.  HENT:     Head: Normocephalic and atraumatic.     Right Ear: Tympanic membrane normal.     Left Ear: Tympanic membrane normal.      Nose: No congestion.     Mouth/Throat:     Mouth: Mucous membranes are moist.     Pharynx: Posterior oropharyngeal erythema present.     Comments: Posterior pharyngeal erythema Eyes:     Conjunctiva/sclera: Conjunctivae normal.     Pupils: Pupils are equal, round, and reactive to light.  Cardiovascular:     Rate and Rhythm: Normal rate and regular rhythm.     Heart sounds: Normal heart sounds.  Pulmonary:     Effort: Pulmonary effort is normal. No respiratory distress.     Breath sounds: Wheezing and rhonchi present.     Comments: Coarse rhonchi with few scattered wheeze throughout Abdominal:     General: There is no distension.     Palpations: Abdomen is soft.  Musculoskeletal:        General: Normal range of motion.     Cervical back: Normal range of motion and neck supple.     Right lower leg: No edema.     Left lower leg: No edema.  Lymphadenopathy:     Cervical: No cervical adenopathy.  Skin:    General: Skin is warm and dry.  Neurological:     Mental Status: She is alert.     Gait: Gait normal.  Psychiatric:     Comments: Emotional lability.  Recent death of husband      UC Treatments / Results  Labs (all labs ordered are listed, but only abnormal results are displayed) Labs Reviewed - No data to display  EKG   Radiology DG Chest 2 View Result Date: 03/05/2024 CLINICAL DATA:  Cough for 2 weeks. EXAM: CHEST - 2 VIEW COMPARISON:  None Available. FINDINGS: Bilateral lung fields are clear. Bilateral costophrenic angles are clear. Normal cardio-mediastinal silhouette. No acute osseous abnormalities. The soft tissues are within normal limits. IMPRESSION: No active cardiopulmonary disease. Electronically Signed   By: Ree Molt M.D.   On: 03/05/2024 09:24    Procedures Procedures (including critical care time)  Medications Ordered in UC Medications - No data to display  Initial Impression / Assessment and Plan / UC Course  I have reviewed the triage vital  signs and the nursing notes.  Pertinent labs & imaging results that were available during my care of the patient were reviewed by me and considered in my medical decision making (see chart for details).     Final Clinical Impressions(s) / UC Diagnoses   Final diagnoses:  Environmental and seasonal allergies  Elevated blood pressure reading in office without diagnosis of hypertension  Acute bronchitis, unspecified organism     Discharge Instructions      X-ray does not reveal pneumonia.  You have acute bronchitis Drink lots of water Take the antibiotic 2 x a day with food Take prednisone  daily in the morning for 5 days May continue over-the-counter cough medications  See Dr Parnell in 2 weeks for follow up.  Need to re check your blood pressure     ED Prescriptions     Medication Sig Dispense Auth. Provider   predniSONE  (DELTASONE ) 20 MG tablet Take 2 tablets (40 mg total) by mouth daily with breakfast. 10 tablet Maranda Jamee Jacob, MD   doxycycline  (VIBRAMYCIN ) 100 MG capsule Take 1 capsule (100 mg total) by mouth 2 (two) times daily. 20 capsule Maranda Jamee Jacob, MD      PDMP not reviewed this encounter.   Maranda Jamee Jacob, MD 03/05/24 [phone]

## 2024-03-05 NOTE — Discharge Instructions (Addendum)
 X-ray does not reveal pneumonia.  You have acute bronchitis Drink lots of water Take the antibiotic 2 x a day with food Take prednisone  daily in the morning for 5 days May continue over-the-counter cough medications  See Dr Parnell in 2 weeks for follow up.  Need to re check your blood pressure

## 2024-03-05 NOTE — ED Triage Notes (Signed)
 Allergic to peach fuzz but made peach jam, 2 weeks ago, has had cough, runny nose initially. Cough won't go away. Has been taking alkaseltzer cold, ginger tea, hot soup.

## 2024-03-05 NOTE — Telephone Encounter (Signed)
 I think she did end up going to urgent care today so lets have her schedule a follow-up with me in about 2 weeks for routine care/grief.

## 2024-03-13 ENCOUNTER — Encounter: Payer: Self-pay | Admitting: Family Medicine

## 2024-03-21 ENCOUNTER — Ambulatory Visit: Admitting: Family Medicine

## 2024-03-21 ENCOUNTER — Encounter: Payer: Self-pay | Admitting: Family Medicine

## 2024-03-21 VITALS — BP 122/80 | HR 89 | Ht 64.0 in | Wt 139.0 lb

## 2024-03-21 DIAGNOSIS — Z23 Encounter for immunization: Secondary | ICD-10-CM | POA: Diagnosis not present

## 2024-03-21 DIAGNOSIS — R5383 Other fatigue: Secondary | ICD-10-CM

## 2024-03-21 DIAGNOSIS — Z13220 Encounter for screening for lipoid disorders: Secondary | ICD-10-CM

## 2024-03-21 DIAGNOSIS — R233 Spontaneous ecchymoses: Secondary | ICD-10-CM | POA: Diagnosis not present

## 2024-03-21 DIAGNOSIS — F4321 Adjustment disorder with depressed mood: Secondary | ICD-10-CM | POA: Diagnosis not present

## 2024-03-21 DIAGNOSIS — Z9621 Cochlear implant status: Secondary | ICD-10-CM

## 2024-03-21 DIAGNOSIS — E782 Mixed hyperlipidemia: Secondary | ICD-10-CM

## 2024-03-21 NOTE — Assessment & Plan Note (Signed)
 For repeat lipid levels it has been a few years since we last evaluated those.

## 2024-03-21 NOTE — Progress Notes (Signed)
 Established Patient Office Visit  Subjective  Patient ID: Vanessa Gutierrez, female    DOB: Jul 20, 1951  Age: 73 y.o. MRN: [id number]  Chief Complaint  Patient presents with   Medical Management of Chronic Issues    Grief , phq gad,    HPI Her husband passed away recently have been a long time coming he had started develop dementia symptoms honestly when they first moved into the local area and it just continued to progress.  Seen in UC on 7/14 for bronchitis and given ABX and steroids. She is doing better but still feeling tired.   Still has a cough but is not keeping her awake at night.  Sleep has been fair usually getting 6 to 7 hours at night.  She did notice significant bruising on her right inner arm and then went down towards her axilla and down her side she says she noticed it after taking the prednisone  and thought maybe it was a side effect.  She thought it might be from her dog pulling as well.  The had a flare with her sciatic nerve not too long ago it is feeling better she mostly used her ice packs.  It was on the left side.  Flowsheet Row Office Visit from 03/21/2024 in [facility]  PHQ-9 Total Score 7      06/12/2020    8:27 AM 02/24/2018    1:00 PM  GAD 7 : Generalized Anxiety Score  Nervous, Anxious, on Edge 0 0  Control/stop worrying 0 0  Worry too much - different things 1 0  Trouble relaxing 0 0  Restless 0 0  Easily annoyed or irritable 0 0  Afraid - awful might happen 0 0  Total GAD 7 Score 1 0  Anxiety Difficulty Not difficult at all Not difficult at all       ROS    Objective:     BP 122/80   Pulse 89   Ht 5' 4 (1.626 m)   Wt 139 lb (63 kg)   SpO2 99%   BMI 23.86 kg/m    Physical Exam Vitals and nursing note reviewed.  Constitutional:      Appearance: Normal appearance.  HENT:     Head: Normocephalic and atraumatic.  Eyes:     Conjunctiva/sclera: Conjunctivae normal.   Cardiovascular:     Rate and Rhythm: Normal rate and regular rhythm.  Pulmonary:     Effort: Pulmonary effort is normal.     Breath sounds: Normal breath sounds.  Skin:    General: Skin is warm and dry.  Neurological:     Mental Status: She is alert.  Psychiatric:        Mood and Affect: Mood normal.      No results found for any visits on 03/21/24.    The ASCVD Risk score (Arnett DK, et al., 2019) failed to calculate for the following reasons:   Cannot find a previous HDL lab   Cannot find a previous total cholesterol lab    Assessment & Plan:   Problem List Items Addressed This Visit       Other   Hyperlipidemia (Chronic)   For repeat lipid levels it has been a few years since we last evaluated those.      Relevant Orders   Lipid panel   Cochlear implant in place   She did pneumonia vaccine today given Prevnar 20       Other Visit  Diagnoses       Immunization due    -  Primary   Relevant Orders   Pneumococcal conjugate vaccine 20-valent (Prevnar 20) (Completed)     Grief       Relevant Orders   CMP14+EGFR   CBC with Differential/Platelet   Lipid panel   INR/PT   TSH     Other fatigue       Relevant Orders   CMP14+EGFR   CBC with Differential/Platelet   Lipid panel   INR/PT   TSH     Easy bruising       Relevant Orders   CMP14+EGFR   CBC with Differential/Platelet   Lipid panel   INR/PT   TSH     Screening, lipid       Relevant Orders   Lipid panel      Bruising, easy-I did recommend that we do some additional labs today she is also been more fatigued but that started when she was diagnosed with bronchitis a couple weeks ago I think it is can take a few more weeks to completely recover.  Return in about 2 months (around 05/22/2024) for Wellness Exam.    Dorothyann Byars, MD

## 2024-03-21 NOTE — Assessment & Plan Note (Addendum)
 She did pneumonia vaccine today given Prevnar 20

## 2024-03-22 ENCOUNTER — Ambulatory Visit: Payer: Self-pay | Admitting: Family Medicine

## 2024-03-22 ENCOUNTER — Other Ambulatory Visit: Payer: Self-pay | Admitting: Family Medicine

## 2024-03-22 DIAGNOSIS — Z780 Asymptomatic menopausal state: Secondary | ICD-10-CM

## 2024-03-22 LAB — PROTIME-INR
INR: 1 (ref 0.9–1.2)
Prothrombin Time: 10.5 s (ref 9.1–12.0)

## 2024-03-22 LAB — CMP14+EGFR
ALT: 16 IU/L (ref 0–32)
AST: 20 IU/L (ref 0–40)
Albumin: 3.9 g/dL (ref 3.8–4.8)
Alkaline Phosphatase: 70 IU/L (ref 44–121)
BUN/Creatinine Ratio: 17 (ref 12–28)
BUN: 11 mg/dL (ref 8–27)
Bilirubin Total: 0.4 mg/dL (ref 0.0–1.2)
CO2: 22 mmol/L (ref 20–29)
Calcium: 9.6 mg/dL (ref 8.7–10.3)
Chloride: 99 mmol/L (ref 96–106)
Creatinine, Ser: 0.64 mg/dL (ref 0.57–1.00)
Globulin, Total: 2.7 g/dL (ref 1.5–4.5)
Glucose: 103 mg/dL — ABNORMAL HIGH (ref 70–99)
Potassium: 4.1 mmol/L (ref 3.5–5.2)
Sodium: 137 mmol/L (ref 134–144)
Total Protein: 6.6 g/dL (ref 6.0–8.5)
eGFR: 94 mL/min/1.73 (ref >59)

## 2024-03-22 LAB — LIPID PANEL
Chol/HDL Ratio: 5 ratio — ABNORMAL HIGH (ref 0.0–4.4)
Cholesterol, Total: 274 mg/dL — ABNORMAL HIGH (ref 100–199)
HDL: 55 mg/dL (ref >39)
LDL Chol Calc (NIH): 196 mg/dL — ABNORMAL HIGH (ref 0–99)
Triglycerides: 127 mg/dL (ref 0–149)
VLDL Cholesterol Cal: 23 mg/dL (ref 5–40)

## 2024-03-22 LAB — CBC WITH DIFFERENTIAL/PLATELET
Basophils Absolute: 0 x10E3/uL (ref 0.0–0.2)
Basos: 0 %
EOS (ABSOLUTE): 0.1 x10E3/uL (ref 0.0–0.4)
Eos: 1 %
Hematocrit: 35.4 % (ref 34.0–46.6)
Hemoglobin: 11.6 g/dL (ref 11.1–15.9)
Immature Grans (Abs): 0 x10E3/uL (ref 0.0–0.1)
Immature Granulocytes: 0 %
Lymphocytes Absolute: 1.3 x10E3/uL (ref 0.7–3.1)
Lymphs: 18 %
MCH: 29.1 pg (ref 26.6–33.0)
MCHC: 32.8 g/dL (ref 31.5–35.7)
MCV: 89 fL (ref 79–97)
Monocytes Absolute: 0.5 x10E3/uL (ref 0.1–0.9)
Monocytes: 7 %
Neutrophils Absolute: 5.2 x10E3/uL (ref 1.4–7.0)
Neutrophils: 74 %
Platelets: 304 x10E3/uL (ref 150–450)
RBC: 3.99 x10E6/uL (ref 3.77–5.28)
RDW: 13.1 % (ref 11.7–15.4)
WBC: 7.1 x10E3/uL (ref 3.4–10.8)

## 2024-03-22 LAB — TSH: TSH: 1.79 u[IU]/mL (ref 0.450–4.500)

## 2024-03-22 NOTE — Progress Notes (Signed)
 Hi Vanessa Gutierrez, metabolic panel overall looks good.  Your LDL cholesterol is extremely high.  We really need to consider medication for you.  We calculated out your 10-year risk for having a heart attack or stroke it is very high at 11.5% so I would strongly recommend that we start a statin to improve your numbers in addition to working on healthy diet and regular exercise.  Your thyroid and blood count are normal.  INR is also normal.  Let me know if you are okay with starting the statin and then we will plan to recheck your blood work in about 8 weeks.  The 10-year ASCVD risk score (Arnett DK, et al., 2019) is: 11.5%   Values used to calculate the score:     Age: 73 years     Clincally relevant sex: Female     Is Non-Hispanic African American: No     Diabetic: No     Tobacco smoker: No     Systolic Blood Pressure: 122 mmHg     Is BP treated: No     HDL Cholesterol: 55 mg/dL     Total Cholesterol: 274 mg/dL

## 2024-05-22 ENCOUNTER — Encounter: Admitting: Family Medicine

## 2024-09-21 ENCOUNTER — Telehealth: Payer: Self-pay | Admitting: Family Medicine

## 2024-09-21 NOTE — Telephone Encounter (Signed)
 LVM advising pt that she is due for mammogram and overdue for her bone density and that she can get both done here. Asked that she return call if ok with getting these done.

## 2024-09-21 NOTE — Telephone Encounter (Signed)
 Call pt: need to schedule mammo. We still screen up to age 74. We can do here oin our building

## 2024-09-26 NOTE — Telephone Encounter (Signed)
 Spoke patient - she will schedule her wellness visit   but declines the mammogram . When asked why she declined. She states she just does not want one.

## 2024-09-26 NOTE — Telephone Encounter (Signed)
 Attempted call. Left a voicemail message with my direct # for return call.
